# Patient Record
Sex: Female | Born: 1995 | Race: Black or African American | Hispanic: No | Marital: Single
Health system: Southern US, Community
[De-identification: ages and names within clinical notes are randomized; demographics above are authoritative.]

---

## 2019-01-17 ENCOUNTER — Emergency Department
Admission: EM | Admit: 2019-01-17 | Discharge: 2019-01-17 | Disposition: A | Discharge status: Home / Self Care | Payer: Medicaid Other | Attending: Emergency Medicine | Admitting: Emergency Medicine

## 2019-01-17 ENCOUNTER — Emergency Department: Payer: Medicaid Other

## 2019-01-17 ENCOUNTER — Other Ambulatory Visit: Payer: Self-pay

## 2019-01-17 DIAGNOSIS — S60212A Contusion of left wrist, initial encounter: Secondary | ICD-10-CM

## 2019-01-17 DIAGNOSIS — Y999 Unspecified external cause status: Secondary | ICD-10-CM | POA: Insufficient documentation

## 2019-01-17 DIAGNOSIS — S60512A Abrasion of left hand, initial encounter: Secondary | ICD-10-CM | POA: Insufficient documentation

## 2019-01-17 DIAGNOSIS — T148XXA Other injury of unspecified body region, initial encounter: Secondary | ICD-10-CM

## 2019-01-17 DIAGNOSIS — S6992XA Unspecified injury of left wrist, hand and finger(s), initial encounter: Secondary | ICD-10-CM | POA: Diagnosis present

## 2019-01-17 DIAGNOSIS — Y92411 Interstate highway as the place of occurrence of the external cause: Secondary | ICD-10-CM | POA: Insufficient documentation

## 2019-01-17 DIAGNOSIS — Y9389 Activity, other specified: Secondary | ICD-10-CM | POA: Insufficient documentation

## 2019-01-17 MED ORDER — IBUPROFEN 800 MG PO TABS: 800.0000 mg | ORAL_TABLET | Freq: Three times a day (TID) | ORAL | 0 refills | Status: AC | PRN

## 2019-01-17 MED ORDER — IBUPROFEN 400 MG PO TABS
400.0000 mg | ORAL_TABLET | Freq: Once | ORAL | Status: AC
  Administered 2019-01-17: 400 mg via ORAL
  Filled 2019-01-17: qty 1

## 2019-01-17 MED ORDER — HYDROCODONE-ACETAMINOPHEN 5-325 MG PO TABS: 1.0000 | ORAL_TABLET | Freq: Four times a day (QID) | ORAL | 0 refills | Status: AC | PRN

## 2019-01-17 MED ORDER — HYDROCODONE-ACETAMINOPHEN 5-325 MG PO TABS
1.0000 | ORAL_TABLET | Freq: Once | ORAL | Status: AC
  Administered 2019-01-17: 1 via ORAL
  Filled 2019-01-17: qty 1

## 2019-01-17 MED ORDER — IBUPROFEN 800 MG PO TABS
800.0000 mg | ORAL_TABLET | Freq: Once | ORAL | Status: DC
  Filled 2019-01-17: qty 1

## 2019-01-17 MED ORDER — IBUPROFEN 400 MG PO TABS
400.0000 mg | ORAL_TABLET | Freq: Once | ORAL | Status: AC | PRN
  Administered 2019-01-17: 400 mg via ORAL
  Filled 2019-01-17: qty 1
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
## 2019-01-17 NOTE — ED Notes (Signed)
Pt texting on telephone; rates her left arm pain 10/10
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
## 2019-01-17 NOTE — Discharge Instructions (Signed)
1.  You may take pain medicines as needed (Motrin/Norco #15). 2.  You may remove wrist splint to bathe and sleep.  Wear as needed for comfort.  Apply ice to affected area several times daily to reduce swelling. 3.  Return to the ER for worsening symptoms, persistent vomiting, difficulty breathing or other concerns.
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
